# Patient Record
Sex: Male | Born: 1995 | Race: White | Hispanic: No | Marital: Single
Health system: Midwestern US, Community
[De-identification: ages and names within clinical notes are randomized; demographics above are authoritative.]

---
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
## 2014-04-17 ENCOUNTER — Ambulatory Visit: Payer: Self-pay | Admitting: Family Medicine

## 2014-04-17 LAB — URINALYSIS, COMPLETE
BLOOD: NEGATIVE
Bacteria: NEGATIVE
Bilirubin,UR: NEGATIVE
GLUCOSE, UR: NEGATIVE
Ketone: NEGATIVE
NITRITE: NEGATIVE
PH: 6.5 (ref 5.0–8.0)
PROTEIN: NEGATIVE
Specific Gravity: 1.02 (ref 1.000–1.030)
Squamous Epithelial: NONE SEEN
WBC UR: >30 /HPF (ref 0–5)

## 2014-04-17 LAB — GC/CHLAMYDIA PROBE AMP

## 2014-06-29 ENCOUNTER — Ambulatory Visit: Payer: 59 | Admitting: Pediatrics

## 2016-01-16 IMAGING — MR MR SHOULDER*R* W/O CM
4 of 6 series · 19 of 40 positions shown · non-contrast
Comparison: None

CLINICAL DATA: Right shoulder injury and pain.  Football injury.

EXAM:
MRI OF THE RIGHT SHOULDER WITHOUT CONTRAST
TECHNIQUE: Multiplanar, multisequence MR imaging of the shoulder was performed.
No intravenous contrast was administered.

[Series 2: T2 fat-sat · axial · 4.0mm · 0.23mm/px · z∈[-34,+66]mm · 7 of 21 slices shown (1 of 3)]
[im 1/21]
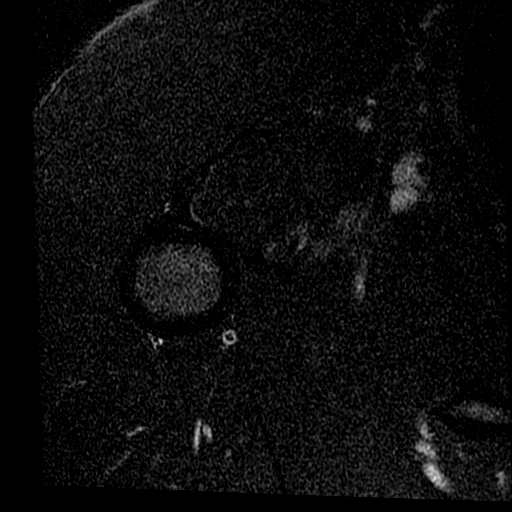
[im 4/21]
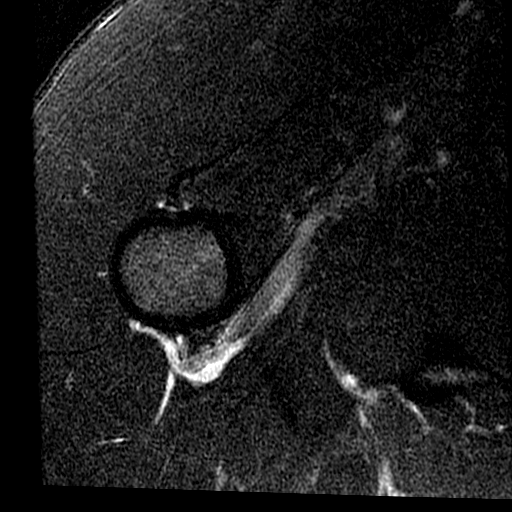
[im 7/21]
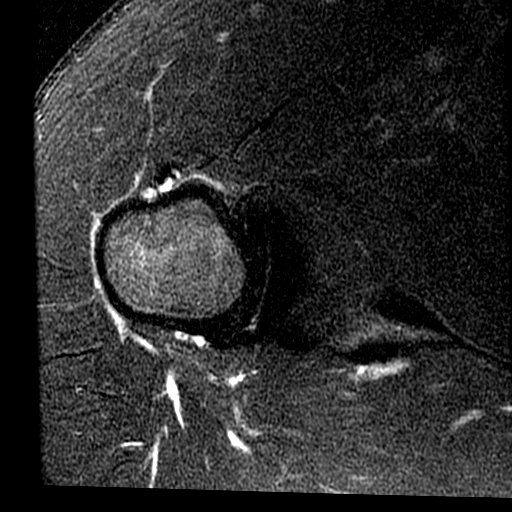
[im 11/21]
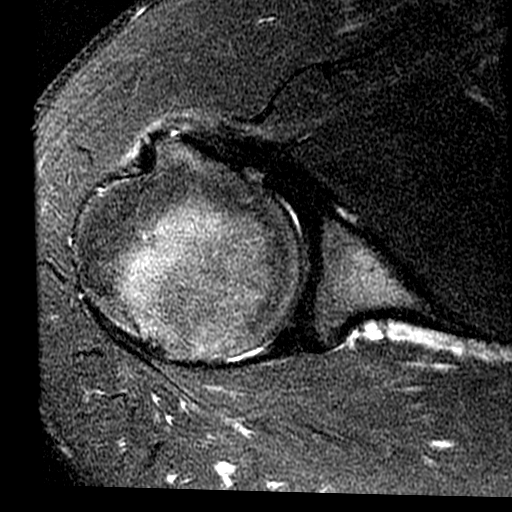
[im 14/21]
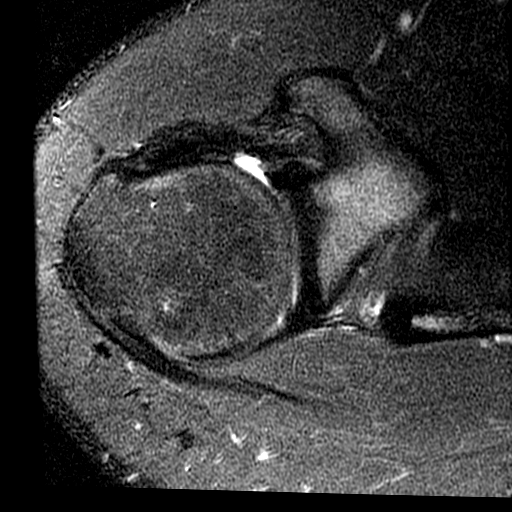
[im 17/21]
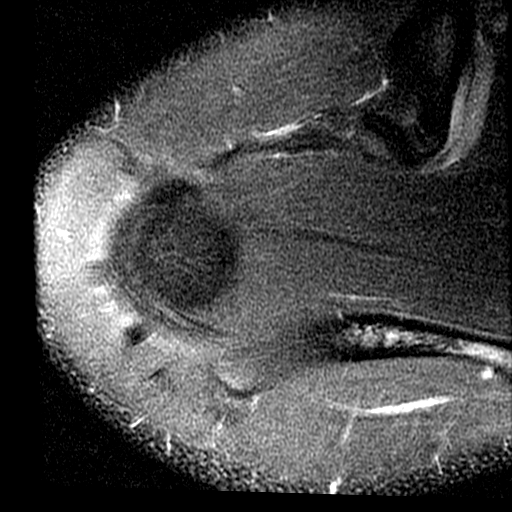
[im 21/21]
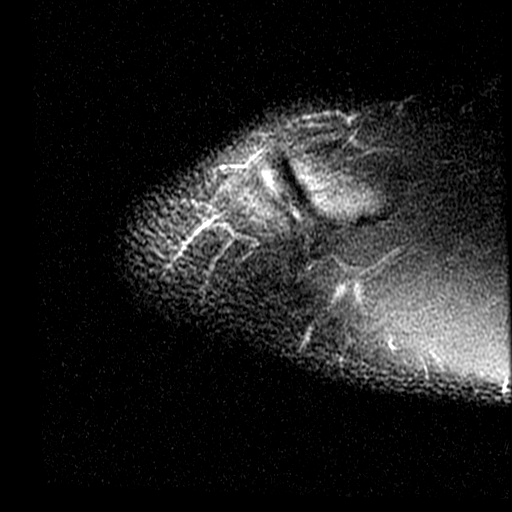

[Series 3: T2 fat-sat · oblique · 4.0mm · 0.29mm/px · 3 of 19 slices shown (2 of 3)]
[im 4/19]
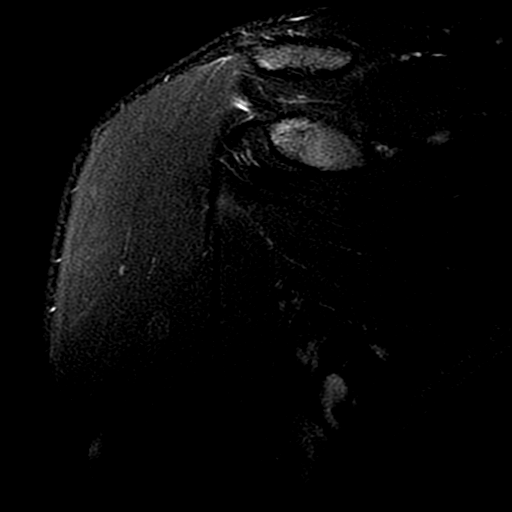
[im 11/19]
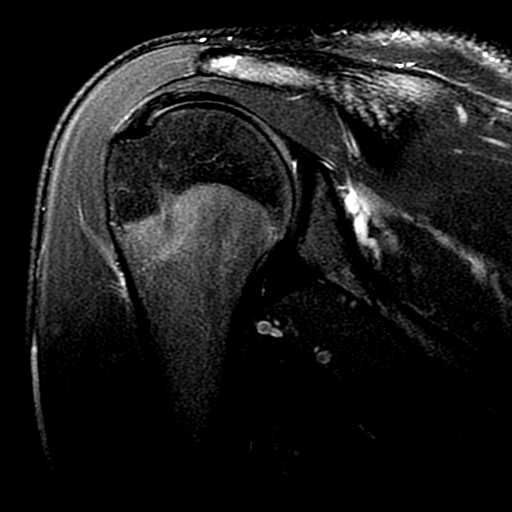
[im 19/19]
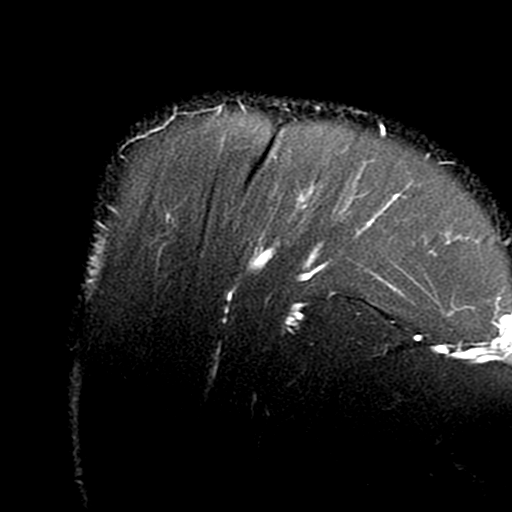

[Series 4: PD · oblique · 4.0mm · 0.29mm/px · 6 of 19 slices shown]
[im 1/19]
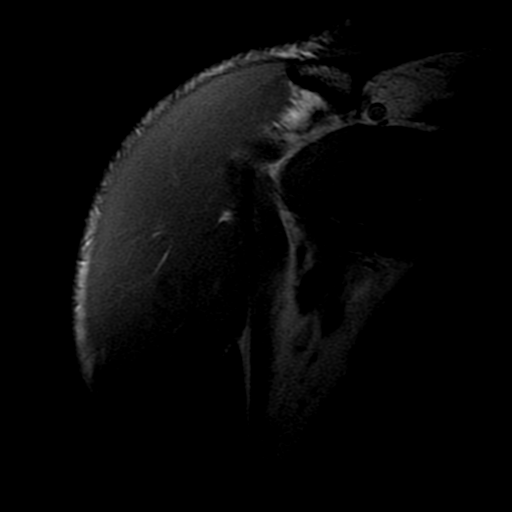
[im 4/19]
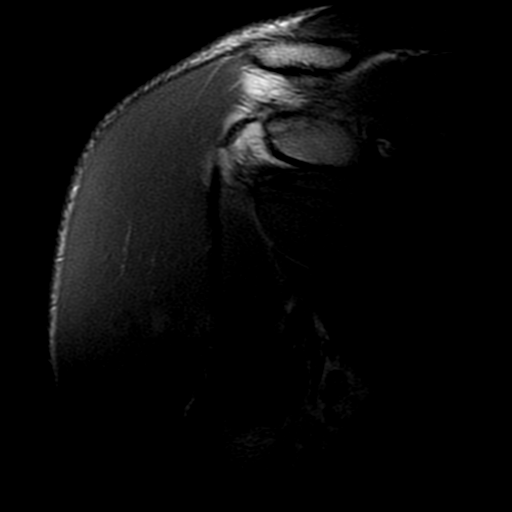
[im 8/19]
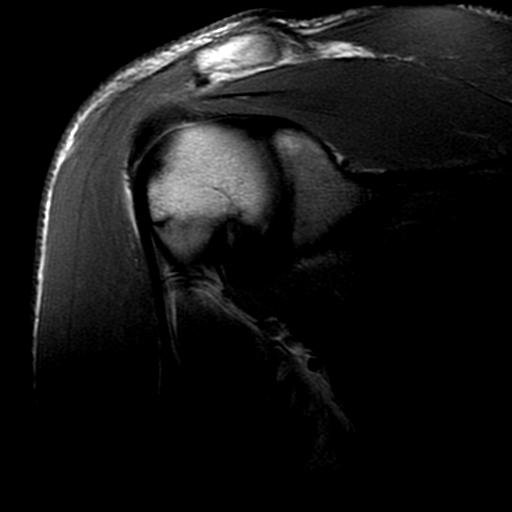
[im 11/19]
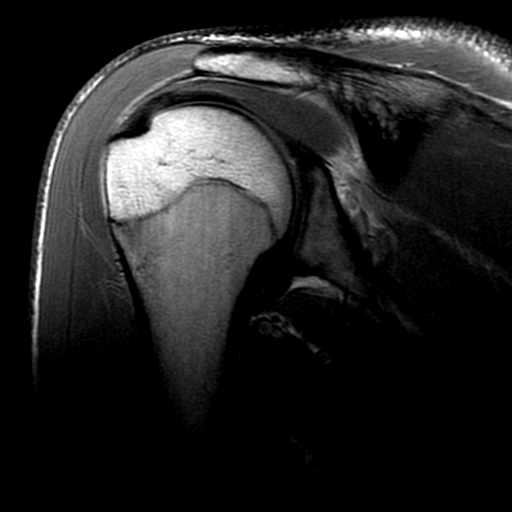
[im 15/19]
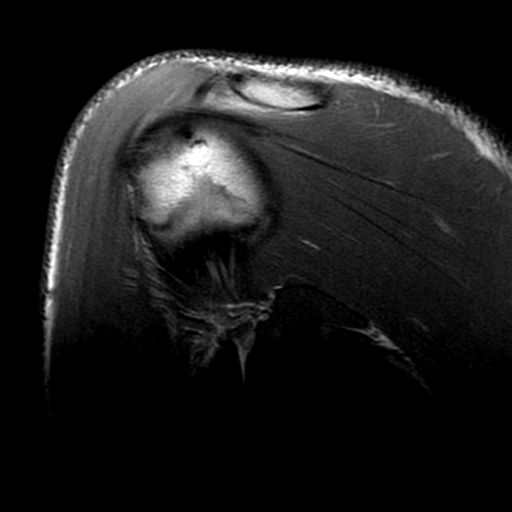
[im 19/19]
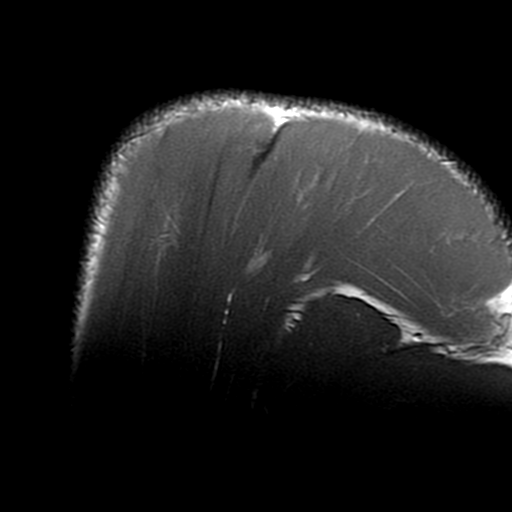

[Series 6: T2 fat-sat · oblique · 4.0mm · 0.29mm/px · 3 of 21 slices shown (3 of 3)]
[im 4/21]
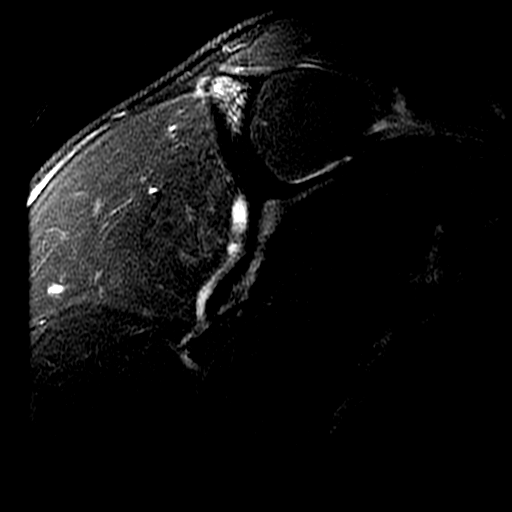
[im 11/21]
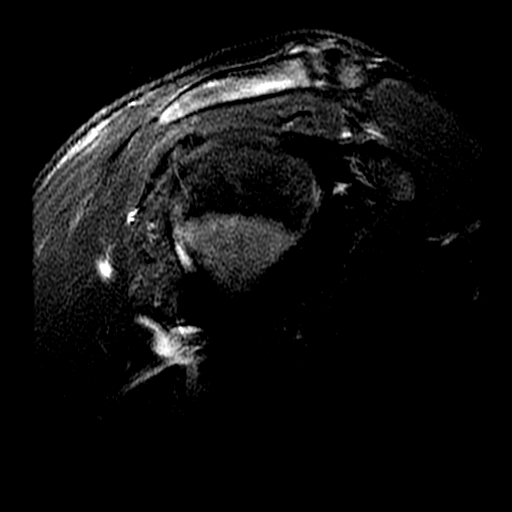
[im 17/21]
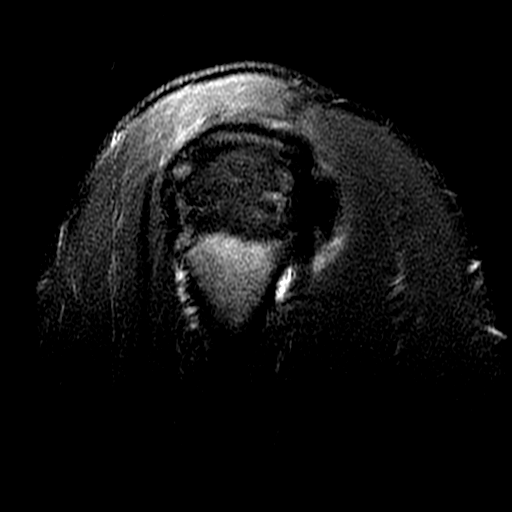

[19 of 40 positions shown; findings below may reference images not displayed]

FINDINGS: Despite efforts by the technologist and patient, motion artifact is
present on today's exam and could not be eliminated. This reduces
exam sensitivity and specificity.

Rotator cuff:  Mild infraspinatus and subscapularis tendinopathy.

Muscles:  Intact

Biceps long head:  Intact

Acromioclavicular Joint: Low-level edema in the subcoracoid bursa
but not in the subacromial subdeltoid bursa. AC joint intact.
Subacromial morphology is type 2 (curved).

Glenohumeral Joint: Intact

Labrum:  Grossly intact

Bones:  Intact
IMPRESSION: 1. Mild subscapularis tendinopathy with a small amount of adjacent
fluid in the subcoracoid bursa suggesting subcoracoid bursitis.
2. Mild infraspinatus tendinopathy.
3. Otherwise negative.

## 2016-02-09 DIAGNOSIS — J1089 Influenza due to other identified influenza virus with other manifestations: Secondary | ICD-10-CM | POA: Diagnosis not present

## 2016-05-31 DIAGNOSIS — R21 Rash and other nonspecific skin eruption: Secondary | ICD-10-CM | POA: Diagnosis not present

## 2016-05-31 DIAGNOSIS — L259 Unspecified contact dermatitis, unspecified cause: Secondary | ICD-10-CM | POA: Diagnosis not present

## 2016-09-18 DIAGNOSIS — R252 Cramp and spasm: Secondary | ICD-10-CM | POA: Diagnosis not present

## 2017-01-23 DIAGNOSIS — H7292 Unspecified perforation of tympanic membrane, left ear: Secondary | ICD-10-CM | POA: Diagnosis not present

## 2017-01-23 DIAGNOSIS — H60502 Unspecified acute noninfective otitis externa, left ear: Secondary | ICD-10-CM | POA: Diagnosis not present

## 2017-10-14 DIAGNOSIS — M25371 Other instability, right ankle: Secondary | ICD-10-CM | POA: Diagnosis not present

## 2017-10-14 DIAGNOSIS — S99911A Unspecified injury of right ankle, initial encounter: Secondary | ICD-10-CM | POA: Diagnosis not present

## 2017-11-07 DIAGNOSIS — M25571 Pain in right ankle and joints of right foot: Secondary | ICD-10-CM | POA: Diagnosis not present

## 2017-11-13 DIAGNOSIS — M25571 Pain in right ankle and joints of right foot: Secondary | ICD-10-CM | POA: Diagnosis not present

## 2017-11-26 DIAGNOSIS — M25571 Pain in right ankle and joints of right foot: Secondary | ICD-10-CM | POA: Diagnosis not present

## 2017-11-28 DIAGNOSIS — M25571 Pain in right ankle and joints of right foot: Secondary | ICD-10-CM | POA: Diagnosis not present

## 2017-12-09 DIAGNOSIS — M25571 Pain in right ankle and joints of right foot: Secondary | ICD-10-CM | POA: Diagnosis not present

## 2018-02-03 DIAGNOSIS — M25371 Other instability, right ankle: Secondary | ICD-10-CM | POA: Diagnosis not present

## 2018-03-27 DIAGNOSIS — H00014 Hordeolum externum left upper eyelid: Secondary | ICD-10-CM | POA: Diagnosis not present

## 2018-03-31 DIAGNOSIS — Z23 Encounter for immunization: Secondary | ICD-10-CM | POA: Diagnosis not present

## 2018-04-02 ENCOUNTER — Telehealth: Payer: 59 | Admitting: Nurse Practitioner

## 2018-04-02 DIAGNOSIS — H00014 Hordeolum externum left upper eyelid: Secondary | ICD-10-CM | POA: Diagnosis not present

## 2018-04-02 MED ORDER — NEOMYCIN-POLYMYXIN-DEXAMETH 0.1 % OP SUSP: 2.0000 [drp] | Freq: Four times a day (QID) | OPHTHALMIC | 0 refills | Status: AC

## 2018-04-02 NOTE — Progress Notes (Signed)
We are sorry that you are not feeling well. Here is how we plan to help!  Based on what you have shared with me it looks like you have a stye.  A stye is an inflammation of the eyelid.  It is often a red, painful lump near the edge of the eyelid that may look like a boil or a pimple.  A stye develops when an infection occurs at the base of an eyelash.   We have made appropriate suggestions for you based upon your presentation: Your symptoms may indicate an infection of the sclera.  The use of anti-inflammatory and antibiotic eye drops for a week will help resolve this condition.  I have sent in neomycin-polymyxin HC opthalmic suspension, two to three drops in the affected eye every 4 hours.  If your symptoms do not improve over the next two to three days you should be seen in your doctor's office.  HOME CARE:   Wash your hands often!  Let the stye open on its own. Don't squeeze or open it.  Don't rub your eyes. This can irritate your eyes and let in bacteria.  If you need to touch your eyes, wash your hands first.  Don't wear eye makeup or contact lenses until the area has healed.  GET HELP RIGHT AWAY IF:   Your symptoms do not improve.  You develop blurred or loss of vision.  Your symptoms worsen (increased discharge, pain or redness).  Thank you for choosing an e-visit.  Your e-visit answers were reviewed by a board certified advanced clinical practitioner to complete your personal care plan.  Depending upon the condition, your plan could have included both over the counter or prescription medications.  Please review your pharmacy choice.  Make sure the pharmacy is open so you can pick up prescription now.  If there is a problem, you may contact your provider through [employer] and have the prescription routed to another pharmacy.    Your safety is important to [place].  If you have drug allergies check your prescription carefully.  For the next 24 hours you can use MyChart to  ask questions about today's visit, request a non-urgent call back, or ask for a work or school excuse.  You will get an email in the next two days asking about your experience.  I hope you that your e-visit has been valuable and will speed your recovery.  6 minutes spent reviewing and documenting in chart.( patient did not put in name of pharmacy and it took me awhile to reach patient to correct pharmacy information)

## 2018-04-07 DIAGNOSIS — H0014 Chalazion left upper eyelid: Secondary | ICD-10-CM | POA: Diagnosis not present

## 2018-04-07 DIAGNOSIS — H0011 Chalazion right upper eyelid: Secondary | ICD-10-CM | POA: Diagnosis not present

## 2018-07-02 DIAGNOSIS — Z20828 Contact with and (suspected) exposure to other viral communicable diseases: Secondary | ICD-10-CM | POA: Diagnosis not present

## 2018-08-09 DIAGNOSIS — Z03818 Encounter for observation for suspected exposure to other biological agents ruled out: Secondary | ICD-10-CM | POA: Diagnosis not present

## 2018-08-18 DIAGNOSIS — Z03818 Encounter for observation for suspected exposure to other biological agents ruled out: Secondary | ICD-10-CM | POA: Diagnosis not present

## 2019-01-22 ENCOUNTER — Ambulatory Visit: Admit: 2019-01-22 | Discharge: 2019-01-22 | Disposition: A | Discharge status: Home / Self Care | Payer: 59
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
## 2019-04-04 ENCOUNTER — Ambulatory Visit: Payer: Self-pay

## 2019-11-03 NOTE — Progress Notes (Signed)
Formatting of this note is different from the original.  Subjective:    Patient ID: Daniel Frazier is a 24 y.o. male.    HPI   Here for forms completion visit for job.      Review of Systems   All other systems reviewed and are negative.    Social History     Tobacco Use   Smoking Status Not on file     No past medical history on file.  No past surgical history on file.  No family history on file.  Objective:   Physical Exam  Constitutional:       Appearance: He is well-developed.   Cardiovascular:      Rate and Rhythm: Normal rate and regular rhythm.   Pulmonary:      Effort: Pulmonary effort is normal.      Breath sounds: Normal breath sounds.   Skin:     General: Skin is warm.   Neurological:      Mental Status: He is alert and oriented to person, place, and time.         Assessment/Plan:                    1. Encounter for completion of form with patient    - Forms Completion                              Electronically signed by Kathi Der, NP at 11/03/2019  5:57 PM EDT

## 2021-08-04 ENCOUNTER — Ambulatory Visit: Admit: 2021-08-04 | Discharge: 2021-08-04 | Payer: BLUE CROSS/BLUE SHIELD | Attending: Physician Assistant

## 2021-08-04 DIAGNOSIS — H6692 Otitis media, unspecified, left ear: Secondary | ICD-10-CM

## 2021-08-04 MED ORDER — AMOXICILLIN 875 MG PO TABS
875 MG | ORAL_TABLET | Freq: Two times a day (BID) | ORAL | 0 refills | Status: AC
Start: 2021-08-04 — End: 2021-08-11

## 2021-08-04 MED ORDER — OFLOXACIN 0.3 % OT SOLN
0.3 % | Freq: Every day | OTIC | 0 refills | Status: AC
Start: 2021-08-04 — End: ?
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
## 2022-01-21 ENCOUNTER — Ambulatory Visit: Admit: 2022-01-21 | Discharge: 2022-01-21 | Payer: BLUE CROSS/BLUE SHIELD | Attending: Emergency Medicine

## 2022-01-21 DIAGNOSIS — H5789 Other specified disorders of eye and adnexa: Secondary | ICD-10-CM

## 2022-01-21 MED ORDER — CIPROFLOXACIN HCL 0.3 % OP SOLN
0.3 % | Freq: Four times a day (QID) | OPHTHALMIC | 0 refills | Status: AC
Start: 2022-01-21 — End: ?
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
## 2022-05-31 ENCOUNTER — Ambulatory Visit: Admit: 2022-05-31 | Discharge: 2022-05-31 | Payer: PRIVATE HEALTH INSURANCE | Attending: Family Medicine

## 2022-05-31 DIAGNOSIS — J029 Acute pharyngitis, unspecified: Secondary | ICD-10-CM

## 2022-05-31 LAB — AMB POC INFLUENZA ASSAY W/OPTIC
Flu A Antigen: NEGATIVE
Flu B Antigen: NEGATIVE

## 2022-05-31 LAB — AMB POC RAPID STREP TEST: Group A Strep Antigen, POC: POSITIVE

## 2022-05-31 MED ORDER — KETOROLAC TROMETHAMINE 30 MG/ML IJ SOLN
30 | Freq: Once | INTRAMUSCULAR | Status: AC
Start: 2022-05-31 — End: 2022-05-31
  Administered 2022-05-31: 17:00:00 30 mg via INTRAMUSCULAR

## 2022-05-31 MED ORDER — ACETAMINOPHEN 325 MG PO TABS
325 | Freq: Once | ORAL | Status: AC
Start: 2022-05-31 — End: 2022-05-31
  Administered 2022-05-31: 17:00:00 975 mg via ORAL

## 2022-05-31 MED ORDER — AMOXICILLIN 500 MG PO CAPS
500 | ORAL_CAPSULE | Freq: Two times a day (BID) | ORAL | 0 refills | Status: AC
Start: 2022-05-31 — End: 2022-06-10

## 2022-05-31 NOTE — Patient Instructions (Addendum)
Initially, patient only requesting influenza testing.  Influenza A/B not detected.  After examination, and expressing my concerns for patient's symptoms of fever, body aches, and overall appearance, I am urging him to get a strep test done.  Rapid strep in the office was positive.  Culture was not sent out.  Starting patient on course of amoxicillin; he is advised to throw away his old toothbrush and get a new toothbrush on day 3 of treatment.  In addition, patient was given shot of Toradol 30 mg here in the office to help with his body aches and pain.    Due to fever, patient was also given 975 mg of Tylenol orally from our stock.  Work note given for him to be out today and at home recovering; he is fit for duty as of tomorrow, although his next scheduled shift is not until 5/12.    You will receive a follow up survey after your visit, usually by e-mail, or by text link.    Please take the time to tell us what you thought of your visit - what we did well...any staff that made your day! Or ways that we can improve!   We also welcome [employer] reviews!  Thanks for helping [place] be the best [facility] possible!

## 2022-05-31 NOTE — Progress Notes (Signed)
Chief Complaint  Chief Complaint   Patient presents with    Generalized Body Aches     Sore throat, fever and chills x 1 day         Subjective     HPI:  Daniel Frazier with (DOB:  Jul 31, 1995) is a 27 y.o. 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  He works at a station in [place], but he lives appear in [place], so came in to see [place].  Knew he was having shaking chills, but did not know he had a fever until he was advised to triage.  Patient denies any known sick contacts, outside of work exposures from coworkers at [employer] and from patient's.  He states only time he has felt this bad with bodyaches and shaking chills, was when he had the flu.  Patient coming in requesting flu testing specifically.  He declines testing for COVID or strep.  Patient is currently on a schedule of working 24 hours and being off 48 hours.  His next anticipated return turned shift is on Sunday, 5/12.      History provided by:  Patient  Language interpreter used: No         No Known Allergies  History reviewed. No pertinent past medical history.  History reviewed. No pertinent surgical history.  Current Medications:  Current Outpatient Medications   Medication Sig Dispense Refill    amoxicillin (AMOXIL) 500 MG capsule Take 1 capsule by mouth 2 times daily for 10 days 20 capsule 0    ciprofloxacin (CILOXAN) 0.3 % ophthalmic solution Place 1 drop into both eyes 4 times daily (Patient not taking: Reported on 05/31/2022) 2.5 mL 0    ofloxacin (FLOXIN OTIC) 0.3 % otic solution Place 10 drops into the left ear daily (Patient not taking: Reported on 05/31/2022) 1 each 0     No current facility-administered medications for this visit.        Review of  System:  Review of Systems    All other systems negative other than noted in HPI         Objective     BP 121/78   Pulse 96   Temp (!) 101.9 F (38.8 C)   Resp 18   Wt 90.3 kg (199 lb)   SpO2 100%   BMI 26.99 kg/m     Physical Exam:  Physical Exam  Vitals and nursing note reviewed.   Constitutional:       General: He is in acute distress.      Appearance: He is ill-appearing and diaphoretic.      Comments: Patient exhibiting shaking chills, and he is read as a beet from his head down to his trunk.   HENT:      Head: Normocephalic and atraumatic.      Right Ear: Tympanic membrane, ear canal and external ear normal.      Left Ear: Tympanic membrane, ear canal and external ear normal.      Mouth/Throat:      Mouth: Mucous membranes are moist.      Pharynx: Posterior oropharyngeal erythema present. No oropharyngeal exudate.   Eyes:  Extraocular Movements: Extraocular movements intact.      Conjunctiva/sclera: Conjunctivae normal.      Pupils: Pupils are equal, round, and reactive to light.   Pulmonary:      Effort: Pulmonary effort is normal. No respiratory distress.      Breath sounds: No wheezing.   Skin:     General: Skin is warm.      Coloration: Skin is pale.      Findings: Erythema present. No rash.   Neurological:      General: No focal deficit present.      Mental Status: He is alert.      Cranial Nerves: No cranial nerve deficit.               Assessment & Plan   ASSESSMENT/PLAN:    ICD-10-CM    1. Sore throat  J02.9 POC Influenza Assay w/Optic (16109)     POC Strep A AG, EIA (Rapid Strep) (60454)      2. Shaking chills  R68.83       3. Fever, unspecified  R50.9 acetaminophen (TYLENOL) tablet 975 mg      4. Strep pharyngitis  J02.0 amoxicillin (AMOXIL) 500 MG capsule      5. Body aches  R52 ketorolac (TORADOL) injection 30 mg        1. Sore throat  -     POC Influenza Assay w/Optic (09811)  -     POC Strep A AG, EIA (Rapid Strep) (91478)  2. Shaking chills  3. Fever, unspecified  -     acetaminophen  (TYLENOL) tablet 975 mg; 975 mg, Oral, ONCE, 1 dose, On Thu 05/31/22 at dose of acetaminophen is 4000 mg from all sources in 24 hours.  4. Strep pharyngitis  -     amoxicillin (AMOXIL) 500 MG capsule; Take 1 capsule by mouth 2 times daily for 10 days, Disp-20 capsule, R-0Normal  5. Body aches  -     ketorolac (TORADOL) injection 30 mg; 30 mg, IntraMUSCular, ONCE, 1 dose, On Thu 05/31/22 at 1300Do not administer for more than 5 days    Allergies reviewed with patient for accuracy, and any errors were corrected.   Results for orders placed or performed in visit on 05/31/22   POC Influenza Assay w/Optic (29562)   Result Value Ref Range    Valid Internal Control, POC pass     Flu A Antigen negative     Flu B Antigen negative    POC Strep A AG, EIA (Rapid Strep) (13086)   Result Value Ref Range    Valid Internal Control, POC yes     Group A Strep Antigen, POC Positive          Patient Instructions   Initially, patient only requesting influenza testing.  Influenza A/B not detected.  After examination, and expressing my concerns for patient's symptoms of fever, body aches, and overall appearance, I am urging him to get a strep test done.  Rapid strep in the office was positive.  Culture was not sent out.  Starting patient on course of amoxicillin; he is advised to throw away his old toothbrush and get a new toothbrush on day 3 of treatment.  In addition, patient was given shot of Toradol 30 mg here in the office to help with his body aches and pain.    Due to fever, patient was also given 975 mg of Tylenol orally from our stock.  Work note given for him to be out  today and at home recovering; he is fit for duty as of tomorrow, although his next scheduled shift is not until 5/12.    You will receive a follow up survey after your visit, usually by e-mail, or by text link.    Please take the time to tell us what you thought of your visit - what we did well...any staff that made your day! Or ways that we can improve!    We also welcome [employer] reviews!  Thanks for helping [place] be the best [facility] possible!       Return if symptoms worsen or fail to improve.       An electronic signature was used to authenticate this note.    --Verlee Rossetti, MD
